# Patient Record
Sex: Male | Born: 1995 | Race: Black or African American | Hispanic: No | Marital: Single | Smoking: Never smoker
Health system: Southern US, Community
[De-identification: ages and names within clinical notes are randomized; demographics above are authoritative.]

## PROBLEM LIST (undated history)

## (undated) HISTORY — PX: TONSILLECTOMY: SUR1361

---
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
## 2017-06-26 ENCOUNTER — Encounter (INDEPENDENT_AMBULATORY_CARE_PROVIDER_SITE_OTHER): Payer: Self-pay

## 2017-06-26 ENCOUNTER — Encounter: Payer: Self-pay | Admitting: Family Medicine

## 2017-06-26 ENCOUNTER — Ambulatory Visit (INDEPENDENT_AMBULATORY_CARE_PROVIDER_SITE_OTHER): Payer: 59 | Admitting: Family Medicine

## 2017-06-26 DIAGNOSIS — R1030 Lower abdominal pain, unspecified: Secondary | ICD-10-CM

## 2017-06-26 DIAGNOSIS — R1032 Left lower quadrant pain: Secondary | ICD-10-CM | POA: Diagnosis not present

## 2017-06-26 HISTORY — DX: Lower abdominal pain, unspecified: R10.30

## 2017-06-26 NOTE — Assessment & Plan Note (Signed)
New problem. No evidence of hernia on exam. Suspect strain.  Advised PRN Ibuprofen if recurs. Call with concerns.

## 2017-06-26 NOTE — Patient Instructions (Signed)
Call me if it recurs.  Follow up annually.  Take care  Dr. Adriana Simasook

## 2017-06-26 NOTE — Progress Notes (Signed)
   Subjective:  Patient ID: Jim Richard, male    DOB: 07/21/1996  Age: 21 y.o. MRN: [id number]  CC: Establish care - Left inguinal pain  HPI Jim Richard is a 21 y.o. male presents to the clinic today to establish care. He has a complaint of left inguinal pain.  Inguinal pain  Left inguinal pain.  Started last month and it lasted for 2 weeks and subsequently resolved. Pain was moderate in severity.  No reports of bulging or mass.  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  No known exacerbating or relieving factors.  No reports of back pain. No testicular pain.  No other associated symptoms.  No other complaints or concerns at this time.  PMH, Surgical Hx, Family Hx, Social History reviewed and updated as below.  History reviewed. No pertinent past medical history.   Past Surgical History:  Procedure Laterality Date  . TONSILLECTOMY     Family History  Problem Relation Age of Onset  . Breast cancer Paternal Grandmother   . Hypertension Maternal Grandmother   . Diabetes Maternal Grandmother   . Stroke Maternal Grandfather   . Hypertension Maternal Grandfather    Social History  Substance Use Topics  . Smoking status: Current Every Day Smoker  . Smokeless tobacco: Never Used  . Alcohol use No   Review of Systems  Genitourinary:       Left inguinal pain.  All other systems reviewed and are negative.  Objective:   Today's Vitals: BP 90/62   Pulse 76   Temp 98.5 F (36.9 C) (Oral)   Ht 5\' 6"  (1.676 m)   Wt 131 lb (59.4 kg)   SpO2 98%   BMI 21.14 kg/m   Physical Exam  Constitutional: He is oriented to person, place, and time. He appears well-developed. No distress.  HENT:  Head: Normocephalic and atraumatic.  Mouth/Throat: Oropharynx is clear and moist.  Eyes: Conjunctivae are normal. No scleral icterus.  Neck: Neck supple.  Cardiovascular: Normal rate and regular rhythm.   No murmur  heard. Pulmonary/Chest: Effort normal and breath sounds normal. He has no wheezes. He has no rales.  Abdominal: Soft. He exhibits no distension. There is no tenderness. There is no rebound and no guarding. Hernia confirmed negative in the left inguinal area.  Genitourinary: Testes normal. Right testis shows no mass. Left testis shows no mass.  Lymphadenopathy:    He has no cervical adenopathy.  Neurological: He is alert and oriented to person, place, and time.  No focal deficits.  Skin: Skin is warm. No rash noted.  Psychiatric: He has a normal mood and affect. His behavior is normal. Thought content normal.  Vitals reviewed.  Assessment & Plan:   Problem List Items Addressed This Visit      Other   Inguinal pain    New problem. No evidence of hernia on exam. Suspect strain.  Advised PRN Ibuprofen if recurs. Call with concerns.        Follow-up: Annually/PRN  Everlene OtherJayce Mikiah Durall DO [facility] [place]

## 2017-10-12 ENCOUNTER — Ambulatory Visit (INDEPENDENT_AMBULATORY_CARE_PROVIDER_SITE_OTHER): Payer: 59

## 2017-10-12 DIAGNOSIS — Z23 Encounter for immunization: Secondary | ICD-10-CM | POA: Diagnosis not present

## 2019-07-01 ENCOUNTER — Ambulatory Visit: Payer: 59 | Admitting: Family Medicine

## 2020-02-09 ENCOUNTER — Ambulatory Visit
Admission: EM | Admit: 2020-02-09 | Discharge: 2020-02-09 | Disposition: A | Discharge status: Home / Self Care | Payer: Managed Care, Other (non HMO) | Attending: Emergency Medicine | Admitting: Emergency Medicine

## 2020-02-09 ENCOUNTER — Other Ambulatory Visit: Payer: Self-pay

## 2020-02-09 DIAGNOSIS — R509 Fever, unspecified: Secondary | ICD-10-CM | POA: Diagnosis not present

## 2020-02-09 DIAGNOSIS — J029 Acute pharyngitis, unspecified: Secondary | ICD-10-CM | POA: Diagnosis present

## 2020-02-09 LAB — POCT RAPID STREP A (OFFICE): Rapid Strep A Screen: NEGATIVE

## 2020-02-09 MED ORDER — AMOXICILLIN 875 MG PO TABS
875.0000 mg | ORAL_TABLET | Freq: Two times a day (BID) | ORAL | 0 refills | Status: AC
Start: 2020-02-09 — End: 2020-02-16

## 2020-02-09 NOTE — ED Provider Notes (Signed)
Jim Richard    CSN: [id number] Arrival date & time: 02/09/20  1717      History   Chief Complaint Chief Complaint  Patient presents with  . Sore Throat    HPI Jim Richard is a 24 y.o. male.   Patient presents with sore throat, fever, body aches x5 days.  He reports a negative COVID test on 02/06/2020.  T-max 101 2 days ago; fever today.  He denies difficulty swallowing, cough, shortness of breath, vomiting, diarrhea, rash, or other symptoms.  Treatment attempted at home with ibuprofen with moderate relief.  The history is provided by the patient.    History reviewed. No pertinent past medical history.  There are no problems to display for this patient.   Past Surgical History:  Procedure Laterality Date  . NO PAST SURGERIES         Home Medications    Prior to Admission medications   Medication Sig Start Date End Date Taking? Authorizing Provider  amoxicillin (AMOXIL) 875 MG tablet Take 1 tablet (875 mg total) by mouth 2 (two) times daily for 7 days. 02/09/20 02/16/20  Mickie Bail, NP    Family History Family History  Problem Relation Age of Onset  . Healthy Mother   . Healthy Father     Social History Social History   Tobacco Use  . Smoking status: Never Smoker  . Smokeless tobacco: Never Used  Substance Use Topics  . Alcohol use: Yes    Comment: occasionally  . Drug use: Never     Allergies   Patient has no known allergies.   Review of Systems Review of Systems  Constitutional: Positive for fever. Negative for chills.  HENT: Positive for sore throat. Negative for congestion, ear pain and rhinorrhea.   Eyes: Negative for pain and visual disturbance.  Respiratory: Negative for cough and shortness of breath.   Cardiovascular: Negative for chest pain and palpitations.  Gastrointestinal: Negative for abdominal pain, diarrhea, nausea and vomiting.  Genitourinary: Negative for dysuria and hematuria.  Musculoskeletal: Negative for  arthralgias and back pain.  Skin: Negative for color change and rash.  Neurological: Negative for seizures and syncope.  All other systems reviewed and are negative.    Physical Exam Triage Vital Signs ED Triage Vitals  Enc Vitals Group     BP 02/09/20 1721 118/81     Pulse Rate 02/09/20 1721 87     Resp 02/09/20 1721 18     Temp 02/09/20 1721 99.4 F (37.4 C)     Temp Source 02/09/20 1721 Oral     SpO2 02/09/20 1721 97 %     Weight 02/09/20 1720 135 lb (61.2 kg)     Height 02/09/20 1720 5\' 7"  (1.702 m)     Head Circumference --      Peak Flow --      Pain Score 02/09/20 1719 7     Pain Loc --      Pain Edu? --      Excl. in GC? --    No data found.  Updated Vital Signs BP 118/81 (BP Location: Left Arm)   Pulse 87   Temp 99.4 F (37.4 C) (Oral)   Resp 18   Ht 5\' 7"  (1.702 m)   Wt 135 lb (61.2 kg)   SpO2 97%   BMI 21.14 kg/m   Visual Acuity Right Eye Distance:   Left Eye Distance:   Bilateral Distance:    Right Eye Near:  Left Eye Near:    Bilateral Near:     Physical Exam Vitals and nursing note reviewed.  Constitutional:      Appearance: He is well-developed.  HENT:     Head: Normocephalic and atraumatic.     Right Ear: Tympanic membrane normal.     Left Ear: Tympanic membrane normal.     Nose: Nose normal.     Mouth/Throat:     Mouth: Mucous membranes are moist.     Pharynx: Posterior oropharyngeal erythema present.     Comments: Few small pustules noted on right side of pharynx.  No difficulty swallowing.   Eyes:     Conjunctiva/sclera: Conjunctivae normal.  Cardiovascular:     Rate and Rhythm: Normal rate and regular rhythm.     Heart sounds: No murmur.  Pulmonary:     Effort: Pulmonary effort is normal. No respiratory distress.     Breath sounds: Normal breath sounds.  Abdominal:     General: Bowel sounds are normal.     Palpations: Abdomen is soft.     Tenderness: There is no abdominal tenderness. There is no guarding or rebound.   Musculoskeletal:     Cervical back: Neck supple.  Skin:    General: Skin is warm and dry.     Findings: No rash.  Neurological:     General: No focal deficit present.     Mental Status: He is alert and oriented to person, place, and time.  Psychiatric:        Mood and Affect: Mood normal.        Behavior: Behavior normal.      UC Treatments / Results  Labs (all labs ordered are listed, but only abnormal results are displayed) Labs Reviewed  CULTURE, GROUP A STREP [facility])  POCT RAPID STREP A (OFFICE)  CYTOLOGY, (ORAL, ANAL, URETHRAL) ANCILLARY ONLY    EKG   Radiology No results found.  Procedures Procedures (including critical care time)  Medications Ordered in UC Medications - No data to display  Initial Impression / Assessment and Plan / UC Course  I have reviewed the triage vital signs and the nursing notes.  Pertinent labs & imaging results that were available during my care of the patient were reviewed by me and considered in my medical decision making (see chart for details).   Acute pharyngitis.  Rapid strep negative; throat culture pending.  Additionally, swab obtained for GC/chlamydia testing; patient is sexually active, including oral sex.  Discussed with patient that we will call him if his test results are positive requiring additional treatment.  Discussed with him that he should follow-up with his PCP if his symptoms or not improving.  Patient agrees to plan of care.      Final Clinical Impressions(s) / UC Diagnoses   Final diagnoses:  Sore throat     Discharge Instructions     Take the amoxicillin as directed.    Your rapid strep test is negative.  A throat culture is pending.  A throat swab for gonorrhea and chlamydia was also sent today.  We will call you if any of your test results are positive.  You may require additional treatment at that time.    Follow-up with your primary care provider if your symptoms are not improving.            ED Prescriptions    Medication Sig Dispense Auth. Provider   amoxicillin (AMOXIL) 875 MG tablet Take 1 tablet (875 mg total) by mouth 2 (  two) times daily for 7 days. 14 tablet Mickie Bail, NP     PDMP not reviewed this encounter.   Mickie Bail, NP 02/09/20 1757

## 2020-02-09 NOTE — Discharge Instructions (Addendum)
Take the amoxicillin as directed.    Your rapid strep test is negative.  A throat culture is pending.  A throat swab for gonorrhea and chlamydia was also sent today.  We will call you if any of your test results are positive.  You may require additional treatment at that time.    Follow-up with your primary care provider if your symptoms are not improving.

## 2020-02-09 NOTE — ED Triage Notes (Signed)
Patient states that on Saturday morning he started having a sore throat, fevers, body aches. Patient was tested for Covid on Monday and test was negative.

## 2020-02-11 LAB — CYTOLOGY, (ORAL, ANAL, URETHRAL) ANCILLARY ONLY
Chlamydia: NEGATIVE
Neisseria Gonorrhea: NEGATIVE

## 2020-02-12 LAB — CULTURE, GROUP A STREP (THRC)

## 2020-03-17 ENCOUNTER — Ambulatory Visit: Payer: Managed Care, Other (non HMO) | Attending: Internal Medicine

## 2020-03-17 DIAGNOSIS — Z23 Encounter for immunization: Secondary | ICD-10-CM

## 2020-03-17 NOTE — Progress Notes (Signed)
   Covid-19 Vaccination Clinic  Name:  Jim Richard    MRN: [id number] DOB: 1996-06-05  03/17/2020  [name] was observed post Covid-19 immunization for 15 minutes without incident. He was provided with Vaccine Information Sheet and instruction to access the V-Safe system.   [name] was instructed to call 911 with any severe reactions post vaccine: Marland Kitchen Difficulty breathing  . Swelling of face and throat  . A fast heartbeat  . A bad rash all over body  . Dizziness and weakness   Immunizations Administered    Name Date Dose VIS Date Route   Pfizer COVID-19 Vaccine 03/17/2020  9:58 AM 0.3 mL 11/18/2019 Intramuscular   Manufacturer: [employer], [employer]   Lot: [id number]   NDC: [address]-4

## 2020-04-10 ENCOUNTER — Ambulatory Visit: Payer: Managed Care, Other (non HMO) | Attending: Internal Medicine

## 2020-04-10 DIAGNOSIS — Z23 Encounter for immunization: Secondary | ICD-10-CM

## 2020-04-10 NOTE — Progress Notes (Signed)
   Covid-19 Vaccination Clinic  Name:  Jim Richard    MRN: [id number] DOB: [birth year]  04/10/2020  [name] was observed post Covid-19 immunization for 15 minutes without incident. He was provided with Vaccine Information Sheet and instruction to access the V-Safe system.   [name] was instructed to call 911 with any severe reactions post vaccine: Marland Kitchen Difficulty breathing  . Swelling of face and throat  . A fast heartbeat  . A bad rash all over body  . Dizziness and weakness   Immunizations Administered    Name Date Dose VIS Date Route   Pfizer COVID-19 Vaccine 04/10/2020  1:29 PM 0.3 mL 02/01/2019 Intramuscular   Manufacturer: [employer], [employer]   Lot: [id number]   NDC: [address]-9

## 2020-04-30 ENCOUNTER — Ambulatory Visit (INDEPENDENT_AMBULATORY_CARE_PROVIDER_SITE_OTHER): Payer: Managed Care, Other (non HMO) | Admitting: Nurse Practitioner

## 2020-04-30 ENCOUNTER — Encounter: Payer: Self-pay | Admitting: Nurse Practitioner

## 2020-04-30 ENCOUNTER — Other Ambulatory Visit: Payer: Self-pay

## 2020-04-30 VITALS — BP 110/78 | HR 86 | Temp 97.4°F | Ht 66.0 in | Wt 133.0 lb

## 2020-04-30 DIAGNOSIS — F418 Other specified anxiety disorders: Secondary | ICD-10-CM

## 2020-04-30 DIAGNOSIS — Z720 Tobacco use: Secondary | ICD-10-CM | POA: Diagnosis not present

## 2020-04-30 DIAGNOSIS — Z1159 Encounter for screening for other viral diseases: Secondary | ICD-10-CM | POA: Insufficient documentation

## 2020-04-30 DIAGNOSIS — F419 Anxiety disorder, unspecified: Secondary | ICD-10-CM

## 2020-04-30 DIAGNOSIS — Z13220 Encounter for screening for lipoid disorders: Secondary | ICD-10-CM | POA: Diagnosis not present

## 2020-04-30 DIAGNOSIS — Z0000 Encounter for general adult medical examination without abnormal findings: Secondary | ICD-10-CM

## 2020-04-30 DIAGNOSIS — Z131 Encounter for screening for diabetes mellitus: Secondary | ICD-10-CM | POA: Diagnosis not present

## 2020-04-30 DIAGNOSIS — Z114 Encounter for screening for human immunodeficiency virus [HIV]: Secondary | ICD-10-CM | POA: Insufficient documentation

## 2020-04-30 LAB — COMPREHENSIVE METABOLIC PANEL
ALT: 17 U/L (ref 0–53)
AST: 22 U/L (ref 0–37)
Albumin: 4.8 g/dL (ref 3.5–5.2)
Alkaline Phosphatase: 58 U/L (ref 39–117)
BUN: 19 mg/dL (ref 6–23)
CO2: 29 mEq/L (ref 19–32)
Calcium: 9.9 mg/dL (ref 8.4–10.5)
Chloride: 103 mEq/L (ref 96–112)
Creatinine, Ser: 1.03 mg/dL (ref 0.40–1.50)
GFR: 109.74 mL/min (ref >60.00)
Glucose, Bld: 88 mg/dL (ref 70–99)
Potassium: 4.7 mEq/L (ref 3.5–5.1)
Sodium: 139 mEq/L (ref 135–145)
Total Bilirubin: 0.3 mg/dL (ref 0.2–1.2)
Total Protein: 6.8 g/dL (ref 6.0–8.3)

## 2020-04-30 LAB — CBC WITH DIFFERENTIAL/PLATELET
Basophils Absolute: 0 10*3/uL (ref 0.0–0.1)
Basophils Relative: 1.2 % (ref 0.0–3.0)
Eosinophils Absolute: 0.1 10*3/uL (ref 0.0–0.7)
Eosinophils Relative: 3.3 % (ref 0.0–5.0)
HCT: 42.7 % (ref 39.0–52.0)
Hemoglobin: 14.3 g/dL (ref 13.0–17.0)
Lymphocytes Relative: 54 % — ABNORMAL HIGH (ref 12.0–46.0)
Lymphs Abs: 2.1 10*3/uL (ref 0.7–4.0)
MCHC: 33.5 g/dL (ref 30.0–36.0)
MCV: 87 fl (ref 78.0–100.0)
Monocytes Absolute: 0.2 10*3/uL (ref 0.1–1.0)
Monocytes Relative: 5.6 % (ref 3.0–12.0)
Neutro Abs: 1.4 10*3/uL (ref 1.4–7.7)
Neutrophils Relative %: 35.9 % — ABNORMAL LOW (ref 43.0–77.0)
Platelets: 279 10*3/uL (ref 150.0–400.0)
RBC: 4.91 Mil/uL (ref 4.22–5.81)
RDW: 13.3 % (ref 11.5–15.5)
WBC: 3.9 10*3/uL — ABNORMAL LOW (ref 4.0–10.5)

## 2020-04-30 LAB — VITAMIN D 25 HYDROXY (VIT D DEFICIENCY, FRACTURES): VITD: 18.82 ng/mL — ABNORMAL LOW (ref 30.00–100.00)

## 2020-04-30 LAB — LIPID PANEL
Cholesterol: 137 mg/dL (ref 0–200)
HDL: 51.9 mg/dL (ref >39.00)
LDL Cholesterol: 69 mg/dL (ref 0–99)
NonHDL: 85.06
Total CHOL/HDL Ratio: 3
Triglycerides: 81 mg/dL (ref 0.0–149.0)
VLDL: 16.2 mg/dL (ref 0.0–40.0)

## 2020-04-30 LAB — TSH: TSH: 2.4 u[IU]/mL (ref 0.35–4.50)

## 2020-04-30 LAB — B12 AND FOLATE PANEL
Folate: 22.7 ng/mL (ref >5.9)
Vitamin B-12: 1136 pg/mL — ABNORMAL HIGH (ref 211–911)

## 2020-04-30 NOTE — Patient Instructions (Addendum)
It was very nice to meet you today.   Please go to the lab for routine labs. We will call you with results or message via MyChart if you use that.   I have placed a referral to Psychiatry for anxiety/depression  and your concerns about  ADHD. I asked that this be done ASAP. If you feel like you need to talk to someone more urgently- call [phone].   Please explore ways to relax and see the recommendations below.  Please return in 1 mos for follow up visit.    Managing Anxiety, Adult After being diagnosed with an anxiety disorder, you may be relieved to know why you have felt or behaved a certain way. You may also feel overwhelmed about the treatment ahead and what it will mean for your life. With care and support, you can manage this condition and recover from it. How to manage lifestyle changes Managing stress and anxiety  Stress is your body's reaction to life changes and events, both good and bad. Most stress will last just a few hours, but stress can be ongoing and can lead to more than just stress. Although stress can play a major role in anxiety, it is not the same as anxiety. Stress is usually caused by something external, such as a deadline, test, or competition. Stress normally passes after the triggering event has ended.  Anxiety is caused by something internal, such as imagining a terrible outcome or worrying that something will go wrong that will devastate you. Anxiety often does not go away even after the triggering event is over, and it can become long-term (chronic) worry. It is important to understand the differences between stress and anxiety and to manage your stress effectively so that it does not lead to an anxious response. Talk with your health care provider or a counselor to learn more about reducing anxiety and stress. He or she may suggest tension reduction techniques, such as:  Music therapy. This can include creating or listening to music that you enjoy and that  inspires you.  Mindfulness-based meditation. This involves being aware of your normal breaths while not trying to control your breathing. It can be done while sitting or walking.  Centering prayer. This involves focusing on a word, phrase, or sacred image that means something to you and brings you peace.  Deep breathing. To do this, expand your stomach and inhale slowly through your nose. Hold your breath for 3-5 seconds. Then exhale slowly, letting your stomach muscles relax.  Self-talk. This involves identifying thought patterns that lead to anxiety reactions and changing those patterns.  Muscle relaxation. This involves tensing muscles and then relaxing them. Choose a tension reduction technique that suits your lifestyle and personality. These techniques take time and practice. Set aside 5-15 minutes a day to do them. Therapists can offer counseling and training in these techniques. The training to help with anxiety may be covered by some insurance plans. Other things you can do to manage stress and anxiety include:  Keeping a stress/anxiety diary. This can help you learn what triggers your reaction and then learn ways to manage your response.  Thinking about how you react to certain situations. You may not be able to control everything, but you can control your response.  Making time for activities that help you relax and not feeling guilty about spending your time in this way.  Visual imagery and yoga can help you stay calm and relax.  Medicines Medicines can help ease symptoms. Medicines  for anxiety include:  Anti-anxiety drugs.  Antidepressants. Medicines are often used as a primary treatment for anxiety disorder. Medicines will be prescribed by a health care provider. When used together, medicines, psychotherapy, and tension reduction techniques may be the most effective treatment. Relationships Relationships can play a big part in helping you recover. Try to spend more time  connecting with trusted friends and family members. Consider going to couples counseling, taking family education classes, or going to family therapy. Therapy can help you and others better understand your condition. How to recognize changes in your anxiety Everyone responds differently to treatment for anxiety. Recovery from anxiety happens when symptoms decrease and stop interfering with your daily activities at home or work. This may mean that you will start to:  Have better concentration and focus. Worry will interfere less in your daily thinking.  Sleep better.  Be less irritable.  Have more energy.  Have improved memory. It is important to recognize when your condition is getting worse. Contact your health care provider if your symptoms interfere with home or work and you feel like your condition is not improving. Follow these instructions at home: Activity  Exercise. Most adults should do the following: ? Exercise for at least 150 minutes each week. The exercise should increase your heart rate and make you sweat (moderate-intensity exercise). ? Strengthening exercises at least twice a week.  Get the right amount and quality of sleep. Most adults need 7-9 hours of sleep each night. Lifestyle   Eat a healthy diet that includes plenty of vegetables, fruits, whole grains, low-fat dairy products, and lean protein. Do not eat a lot of foods that are high in solid fats, added sugars, or salt.  Make choices that simplify your life.  Do not use any products that contain nicotine or tobacco, such as cigarettes, e-cigarettes, and chewing tobacco. If you need help quitting, ask your health care provider.  Avoid caffeine, alcohol, and certain over-the-counter cold medicines. These may make you feel worse. Ask your pharmacist which medicines to avoid. General instructions  Take over-the-counter and prescription medicines only as told by your health care provider.  Keep all follow-up  visits as told by your health care provider. This is important. Where to find support You can get help and support from these sources:  Self-help groups.  Online and [employer].  A trusted spiritual leader.  Couples counseling.  Family education classes.  Family therapy. Where to find more information You may find that joining a support group helps you deal with your anxiety. The following sources can help you locate counselors or support groups near you:  [place]: www.mentalhealth[place].net  Anxiety and Depression Association of [place] (ADAA): https://www.clark.net/  National Alliance on Mental Illness (NAMI): www.nami.org Contact a health care provider if you:  Have a hard time staying focused or finishing daily tasks.  Spend many hours a day feeling worried about everyday life.  Become exhausted by worry.  Start to have headaches, feel tense, or have nausea.  Urinate more than normal.  Have diarrhea. Get help right away if you have:  A racing heart and shortness of breath.  Thoughts of hurting yourself or others. If you ever feel like you may hurt yourself or others, or have thoughts about taking your own life, get help right away. You can go to your nearest emergency department or call:  Your local emergency services (911 in the [place]  A suicide crisis helpline, such as the [place]  at 3162560979. This is open 24 hours a day. Summary  Taking steps to learn and use tension reduction techniques can help calm you and help prevent triggering an anxiety reaction.  When used together, medicines, psychotherapy, and tension reduction techniques may be the most effective treatment.  Family, friends, and partners can play a big part in helping you recover from an anxiety disorder. This information is not intended to replace advice given to you by your health care provider. Make sure you discuss any questions you have  with your health care provider. Document Revised: 04/26/2019 Document Reviewed: 04/26/2019 Elsevier Patient Education  2020 [employer].  Depression Screening Depression screening is a tool that your health care provider can use to learn if you have symptoms of depression. Depression is a common condition with many symptoms that are also often found in other conditions. Depression is treatable, but it must first be diagnosed. You may not know that certain feelings, thoughts, and behaviors that you are having can be symptoms of depression. Taking a depression screening test can help you and your health care provider decide if you need more assessment, or if you should be referred to a mental health care provider. What are the screening tests?  You may have a physical exam to see if another condition is affecting your mental health. You may have a blood or urine sample taken during the physical exam.  You may be interviewed using a screening tool that was developed from research, such as one of these: ? Patient Health Questionnaire (PHQ). This is a set of either 2 or 9 questions. A health care provider who has been trained to score this screening test uses a guide to assess if your symptoms suggest that you may have depression. ? Hamilton Depression Rating Scale (HAM-D). This is a set of either 17 or 24 questions. You may be asked to take it again during or after your treatment, to see if your depression has gotten better. ? Beck Depression Inventory (BDI). This is a set of 21 multiple choice questions. Your health care provider scores your answers to assess:  Your level of depression, ranging from mild to severe.  Your response to treatment.  Your health care provider may talk with you about your daily activities, such as eating, sleeping, work, and recreation, and ask if you have had any changes in activity.  Your health care provider may ask you to see a mental health specialist, such as a  psychiatrist or psychologist, for more evaluation. Who should be screened for depression?   All adults, including adults with a family history of a mental health disorder.  Adolescents who are 69-94 years old.  People who are recovering from a myocardial infarction (MI).  Pregnant women, or women who have given birth.  People who have a long-term (chronic) illness.  Anyone who has been diagnosed with another type of a mental health disorder.  Anyone who has symptoms that could show depression. What do my results mean? Your health care provider will review the results of your depression screening, physical exam, and lab tests. Positive screens suggest that you may have depression. Screening is the first step in getting the care that you may need. It is up to you to get your screening results. Ask your health care provider, or the department that is doing your screening tests, when your results will be ready. Talk with your health care provider about your results and diagnosis. A diagnosis of depression is made using  the Diagnostic and Statistical Manual of Mental Disorders (DSM-V). This is a book that lists the number and type of symptoms that must be present for a health care provider to give a specific diagnosis.  Your health care provider may work with you to treat your symptoms of depression, or your health care provider may help you find a mental health provider who can assess, diagnose, and treat your depression. Get help right away if:  You have thoughts about hurting yourself or others. If you ever feel like you may hurt yourself or others, or have thoughts about taking your own life, get help right away. You can go to your nearest emergency department or call:  Your local emergency services (911 in the [place]  A suicide crisis helpline, such as the National Suicide Prevention Lifeline at 367-866-8208. This is open 24 hours a day. Summary  Depression screening is the first  step in getting the help that you may need.  If your screening test shows symptoms of depression (is positive), your health care provider may ask you to see a mental health provider.  Anyone who is age 19 or older should be screened for depression. This information is not intended to replace advice given to you by your health care provider. Make sure you discuss any questions you have with your health care provider. Document Revised: 11/06/2017 Document Reviewed: 04/10/2017 Elsevier Patient Education  2020 [employer].

## 2020-04-30 NOTE — Progress Notes (Signed)
New Patient Office Visit  Subjective:  Patient ID: Jim Richard, male    DOB: Sep 20, 1996  Age: 24 y.o. MRN: [id number]  CC:  Chief Complaint  Patient presents with  . New Patient (Initial Visit)    establish care    HPI Jim Richard is a 24 yo old with no significant PMH presents to establish care with a primary care provider.  He wishes to discuss ADHD.  People he works with are asking him if he has it.  His little brother has ADHD.  The patient skips from topic to topic during conversation.  He does not like to be around people in large groups.  He has social anxiety.  This started in high school. People stare at him. He worries and feels anxious a lot. He strays away from movies and feels like he does not pay attention. He had trouble studying in school. He is working at [employer] now. He is single lives at home with his parents and siblings.   Preventative Healthcare:   Immunizations  Covid: yes  Hepatitis C screening - no  Labs: no  Exercise: active at work and plays basketball  Alcohol use: yes- 8 shots of liquor in one day on weekends- counseled  Smoking/tobacco use: vaping daily nicotine-counseled  STD/HIV testing: no  Regular dental exams: yes  Vision: yes  Wears seat belt: always  Text driving: sometimes counseled    History reviewed. No pertinent past medical history.  Past Surgical History:  Procedure Laterality Date  . TONSILLECTOMY      Family History  Problem Relation Age of Onset  . Healthy Mother   . Healthy Father   . Breast cancer Paternal Grandmother   . Hypertension Maternal Grandmother   . Diabetes Maternal Grandmother   . Stroke Maternal Grandfather   . Hypertension Maternal Grandfather   . ADD / ADHD Brother     Social History   Socioeconomic History  . Marital status: Single    Spouse name: Not on file  . Number of children: Not on file  . Years of education: Not on file  . Highest education level: GED or  equivalent  Occupational History  . Not on file  Tobacco Use  . Smoking status: Current Every Day Smoker    Packs/day: 0.50    Types: Cigarettes  . Smokeless tobacco: Never Used  Substance and Sexual Activity  . Alcohol use: Yes    Comment: occasionally-8 shots of liquor on weekend one day   . Drug use: Never  . Sexual activity: Yes    Birth control/protection: Condom  Other Topics Concern  . Not on file  Social History Narrative       Single lives with parents and 2 younger brothers   Social Determinants of Health   Financial Resource Strain:   . Difficulty of Paying Living Expenses:   Food Insecurity:   . Worried About Programme researcher, broadcasting/film/video in the Last Year:   . Barista in the Last Year:   Transportation Needs:   . Freight forwarder (Medical):   Marland Kitchen Lack of Transportation (Non-Medical):   Physical Activity:   . Days of Exercise per Week:   . Minutes of Exercise per Session:   Stress:   . Feeling of Stress :   Social Connections:   . Frequency of Communication with Friends and Family:   . Frequency of Social Gatherings with Friends and Family:   . Attends Religious Services:   .  Active Member of Clubs or Organizations:   . Attends Archivist Meetings:   Marland Kitchen Marital Status:   Intimate Partner Violence:   . Fear of Current or Ex-Partner:   . Emotionally Abused:   Marland Kitchen Physically Abused:   . Sexually Abused:     ROS Review of Systems  Constitutional: Negative for activity change, fatigue, fever and unexpected weight change.  HENT: Negative for congestion and sinus pain.   Eyes: Negative.   Respiratory: Negative for cough and shortness of breath.   Cardiovascular: Negative for chest pain and leg swelling.  Gastrointestinal: Negative for abdominal pain, blood in stool, constipation, diarrhea, nausea and vomiting.  Genitourinary: Negative for difficulty urinating.  Musculoskeletal: Negative for back pain and joint swelling.  Skin: Negative for rash.   Allergic/Immunologic: Positive for environmental allergies.       Nose runs at [place] where he works if he spends time in the [place]- esp near [place] straw. He does not think it is bad enough to take any meds for it.   Neurological: Negative for dizziness, tremors, seizures and headaches.  Hematological: Negative.  Does not bruise/bleed easily.  Psychiatric/Behavioral:       Positive anxiety and wants tested for ADHD.     Objective:   Today's Vitals: BP 110/78 (BP Location: Left Arm, Patient Position: Sitting, Cuff Size: Small)   Pulse 86   Temp (!) 97.4 F (36.3 C) (Skin)   Ht 5\' 6"  (1.676 m)   Wt 133 lb (60.3 kg)   SpO2 98%   BMI 21.47 kg/m   Physical Exam Vitals reviewed.  Constitutional:      Appearance: Normal appearance. He is normal weight.  HENT:     Head: Normocephalic.  Eyes:     Conjunctiva/sclera: Conjunctivae normal.     Pupils: Pupils are equal, round, and reactive to light.  Cardiovascular:     Rate and Rhythm: Normal rate and regular rhythm.     Pulses: Normal pulses.     Heart sounds: Normal heart sounds.  Pulmonary:     Effort: Pulmonary effort is normal.     Breath sounds: Normal breath sounds.  Abdominal:     General: Abdomen is flat.     Palpations: Abdomen is soft.     Tenderness: There is no abdominal tenderness.  Musculoskeletal:        General: Normal range of motion.  Skin:    General: Skin is warm and dry.  Neurological:     General: No focal deficit present.     Mental Status: He is alert and oriented to person, place, and time.  Psychiatric:        Behavior: Behavior normal.        Thought Content: Thought content normal.     Comments: Flat affect, PHQ-9: 15, GAD-7: 18. No SI- " I would never do that. " No HI.      Assessment & Plan:   Problem List Items Addressed This Visit    None    Visit Diagnoses    Encounter for medical examination to establish care    -  Primary   Anxiety       Relevant Orders   CBC with  Differential/Platelet (Completed)   TSH (Completed)   VITAMIN D 25 Hydroxy (Vit-D Deficiency, Fractures) (Completed)   Ambulatory referral to Psychiatry   B12 and Folate Panel (Completed)   Tobacco abuse       Screening cholesterol level  Relevant Orders   Lipid panel (Completed)   Screening for diabetes mellitus (DM)       Relevant Orders   Comprehensive metabolic panel (Completed)   Screening for HIV (human immunodeficiency virus)       Relevant Orders   HIV antibody (with reflex)   Encounter for HCV screening test for low risk patient       Relevant Orders   Hepatitis C Antibody      No outpatient encounter medications on file as of 04/30/2020.   No facility-administered encounter medications on file as of 04/30/2020.   Please go to the lab for routine labs. We will call you with results or message via MyChart if you use that.   I have placed a referral to Psychiatry for anxiety/depression  and your concerns about  ADHD. I asked that this be done ASAP. If you feel like you need to talk to someone more urgently- call [phone].   Please explore ways to relax and see the recommendations below.  Please return in 1 mos for follow up visit.  AVS: ANXIETY MANAGING and the meaning of his DEPRESSION SCREENING   A total of 45 minutes of face to face time was spent with patient more than half of which was spent in counseling and coordination of care    Follow-up: Return in about 1 month (around 05/31/2020).   Amedeo Kinsman, NP

## 2020-04-30 NOTE — Assessment & Plan Note (Signed)
High scores on screening tests . No SI/HI. Psych referral. He wants tested for ADHD.

## 2020-04-30 NOTE — Assessment & Plan Note (Signed)
Using nicotine to relax for untreated anxiety. Referral to psych for anxiety. We discussed why nicotine is bad for health.

## 2020-05-01 LAB — HEPATITIS C ANTIBODY
Hepatitis C Ab: NONREACTIVE
SIGNAL TO CUT-OFF: 0 (ref <1.00)

## 2020-05-01 LAB — HIV ANTIBODY (ROUTINE TESTING W REFLEX): HIV 1&2 Ab, 4th Generation: NONREACTIVE

## 2020-05-31 ENCOUNTER — Ambulatory Visit: Payer: Managed Care, Other (non HMO) | Admitting: Nurse Practitioner

## 2020-05-31 DIAGNOSIS — Z0289 Encounter for other administrative examinations: Secondary | ICD-10-CM

## 2023-04-23 ENCOUNTER — Encounter: Payer: Self-pay | Admitting: Nurse Practitioner

## 2023-04-23 ENCOUNTER — Ambulatory Visit: Payer: Managed Care, Other (non HMO) | Admitting: Nurse Practitioner

## 2023-04-23 ENCOUNTER — Other Ambulatory Visit: Payer: Self-pay | Admitting: Nurse Practitioner

## 2023-04-23 VITALS — BP 100/62 | HR 71 | Temp 98.8°F | Ht 66.0 in | Wt 133.6 lb

## 2023-04-23 DIAGNOSIS — Z1329 Encounter for screening for other suspected endocrine disorder: Secondary | ICD-10-CM | POA: Diagnosis not present

## 2023-04-23 DIAGNOSIS — Z23 Encounter for immunization: Secondary | ICD-10-CM

## 2023-04-23 DIAGNOSIS — Z13220 Encounter for screening for lipoid disorders: Secondary | ICD-10-CM

## 2023-04-23 DIAGNOSIS — E559 Vitamin D deficiency, unspecified: Secondary | ICD-10-CM

## 2023-04-23 DIAGNOSIS — D709 Neutropenia, unspecified: Secondary | ICD-10-CM | POA: Insufficient documentation

## 2023-04-23 DIAGNOSIS — F418 Other specified anxiety disorders: Secondary | ICD-10-CM | POA: Diagnosis not present

## 2023-04-23 DIAGNOSIS — Z0000 Encounter for general adult medical examination without abnormal findings: Secondary | ICD-10-CM

## 2023-04-23 DIAGNOSIS — Z131 Encounter for screening for diabetes mellitus: Secondary | ICD-10-CM | POA: Diagnosis not present

## 2023-04-23 LAB — COMPREHENSIVE METABOLIC PANEL
ALT: 10 U/L (ref 0–53)
AST: 12 U/L (ref 0–37)
Albumin: 4.8 g/dL (ref 3.5–5.2)
Alkaline Phosphatase: 53 U/L (ref 39–117)
BUN: 24 mg/dL — ABNORMAL HIGH (ref 6–23)
CO2: 28 mEq/L (ref 19–32)
Calcium: 9.7 mg/dL (ref 8.4–10.5)
Chloride: 102 mEq/L (ref 96–112)
Creatinine, Ser: 1.26 mg/dL (ref 0.40–1.50)
GFR: 79.82 mL/min (ref >60.00)
Glucose, Bld: 90 mg/dL (ref 70–99)
Potassium: 4.6 mEq/L (ref 3.5–5.1)
Sodium: 137 mEq/L (ref 135–145)
Total Bilirubin: 0.4 mg/dL (ref 0.2–1.2)
Total Protein: 7.1 g/dL (ref 6.0–8.3)

## 2023-04-23 LAB — CBC WITH DIFFERENTIAL/PLATELET
Basophils Absolute: 0 10*3/uL (ref 0.0–0.1)
Basophils Relative: 1.2 % (ref 0.0–3.0)
Eosinophils Absolute: 0 10*3/uL (ref 0.0–0.7)
Eosinophils Relative: 1.1 % (ref 0.0–5.0)
HCT: 43.4 % (ref 39.0–52.0)
Hemoglobin: 14.4 g/dL (ref 13.0–17.0)
Lymphocytes Relative: 53.6 % — ABNORMAL HIGH (ref 12.0–46.0)
Lymphs Abs: 1.8 10*3/uL (ref 0.7–4.0)
MCHC: 33.1 g/dL (ref 30.0–36.0)
MCV: 88.3 fl (ref 78.0–100.0)
Monocytes Absolute: 0.3 10*3/uL (ref 0.1–1.0)
Monocytes Relative: 8.4 % (ref 3.0–12.0)
Neutro Abs: 1.2 10*3/uL — ABNORMAL LOW (ref 1.4–7.7)
Neutrophils Relative %: 35.7 % — ABNORMAL LOW (ref 43.0–77.0)
Platelets: 290 10*3/uL (ref 150.0–400.0)
RBC: 4.92 Mil/uL (ref 4.22–5.81)
RDW: 13.2 % (ref 11.5–15.5)
WBC: 3.3 10*3/uL — ABNORMAL LOW (ref 4.0–10.5)

## 2023-04-23 LAB — LIPID PANEL
Cholesterol: 179 mg/dL (ref 0–200)
HDL: 63 mg/dL (ref >39.00)
LDL Cholesterol: 96 mg/dL (ref 0–99)
NonHDL: 115.76
Total CHOL/HDL Ratio: 3
Triglycerides: 97 mg/dL (ref 0.0–149.0)
VLDL: 19.4 mg/dL (ref 0.0–40.0)

## 2023-04-23 LAB — HEMOGLOBIN A1C: Hgb A1c MFr Bld: 5.6 % (ref 4.6–6.5)

## 2023-04-23 LAB — VITAMIN D 25 HYDROXY (VIT D DEFICIENCY, FRACTURES): VITD: 8.43 ng/mL — ABNORMAL LOW (ref 30.00–100.00)

## 2023-04-23 LAB — TSH: TSH: 2.07 u[IU]/mL (ref 0.35–5.50)

## 2023-04-23 MED ORDER — VITAMIN D (ERGOCALCIFEROL) 1.25 MG (50000 UNIT) PO CAPS
50000.0000 [IU] | ORAL_CAPSULE | ORAL | 1 refills | Status: AC
Start: 2023-04-23 — End: ?

## 2023-04-23 NOTE — Assessment & Plan Note (Signed)
Chronic. Stable at this time. He feels that his symptoms are well managed and does not need medication or to see a counselor/therapist. PHQ- 8 and GAD- 6. Denies SI/HI. Reports increased anxiety only when having to drive on the interstate/highway. Encouraged to contact if worsening or changing symptoms. Will continue to monitor.

## 2023-04-23 NOTE — Assessment & Plan Note (Signed)
Physical exam complete. Lab work as outlined. Will contact patient with results. Flu vaccine not due. Tetanus vaccine- due, given today in office. Declined additional COVID vaccines. HIV/Hep C screenings negative. Counseled on vaping and nicotine use. Recommended follow ups with Dentist and Ophthalmology for annual exams. Encouraged to work on [employer], decreasing sodium intake, and continue exercising. Return to care in one year, sooner PRN.

## 2023-04-23 NOTE — Progress Notes (Signed)
Bethanie Dicker, NP-C Phone: [phone]  Jim Richard is a 27 y.o. male who presents today for transfer of care and annual exam. He has no complaints or new concerns today. He is not on any medications.   Diet: Poor- Ramen noodles, does not each much vegetables, likes fruit, eats out frequently Exercise: Push ups and sit ups, does a lot of lifting at work Family history-  Prostate cancer: No  Colon cancer: No Sexually active: Yes Vaccines-   Flu: Not due  Tetanus: 07/06/2009- Due!  COVID19: x 2 HIV screening: Negative Hep C Screening: Negative Tobacco use: Yes, vaping Alcohol use: Yes, twice a week Illicit Drug use: No Dentist: Yes Ophthalmology: Yes   Social History   Tobacco Use  Smoking Status Former   Packs/day: .5   Types: Cigarettes  Smokeless Tobacco Never    No current outpatient medications on file prior to visit.   No current facility-administered medications on file prior to visit.    ROS see history of present illness  Objective  Physical Exam Vitals:   04/23/23 1251  BP: 100/62  Pulse: 71  Temp: 98.8 F (37.1 C)  SpO2: 98%    BP Readings from Last 3 Encounters:  04/23/23 100/62  04/30/20 110/78  02/09/20 118/81   Wt Readings from Last 3 Encounters:  04/23/23 133 lb 9.6 oz (60.6 kg)  04/30/20 133 lb (60.3 kg)  02/09/20 135 lb (61.2 kg)    Physical Exam Constitutional:      General: He is not in acute distress.    Appearance: Normal appearance.  HENT:     Head: Normocephalic.     Right Ear: Tympanic membrane normal.     Left Ear: Tympanic membrane normal.     Nose: Nose normal.     Mouth/Throat:     Mouth: Mucous membranes are moist.     Pharynx: Oropharynx is clear.  Eyes:     Conjunctiva/sclera: Conjunctivae normal.     Pupils: Pupils are equal, round, and reactive to light.  Neck:     Thyroid: No thyromegaly.  Cardiovascular:     Rate and Rhythm: Normal rate and regular rhythm.     Heart sounds: Normal heart sounds.   Pulmonary:     Effort: Pulmonary effort is normal.     Breath sounds: Normal breath sounds.  Abdominal:     General: Abdomen is flat. Bowel sounds are normal.     Palpations: Abdomen is soft. There is no mass.     Tenderness: There is no abdominal tenderness.  Musculoskeletal:        General: Normal range of motion.  Lymphadenopathy:     Cervical: No cervical adenopathy.  Skin:    General: Skin is warm and dry.     Findings: No rash.  Neurological:     General: No focal deficit present.     Mental Status: He is alert.  Psychiatric:        Mood and Affect: Mood normal.        Behavior: Behavior normal.    Assessment/Plan: Please see individual problem list.  Preventative health care Assessment & Plan: Physical exam complete. Lab work as outlined. Will contact patient with results. Flu vaccine not due. Tetanus vaccine- due, given today in office. Declined additional COVID vaccines. HIV/Hep C screenings negative. Counseled on vaping and nicotine use. Recommended follow ups with Dentist and Ophthalmology for annual exams. Encouraged to work on [employer], decreasing sodium intake, and continue exercising. Return to care  in one year, sooner PRN.   Orders: -     CBC with Differential/Platelet -     Comprehensive metabolic panel  Depression with anxiety Assessment & Plan: Chronic. Stable at this time. He feels that his symptoms are well managed and does not need medication or to see a counselor/therapist. PHQ- 8 and GAD- 6. Denies SI/HI. Reports increased anxiety only when having to drive on the interstate/highway. Encouraged to contact if worsening or changing symptoms. Will continue to monitor.    Vitamin D deficiency Assessment & Plan: Noted on lab work in 2021. Not taking a supplement. Will check vitamin D level today.   Orders: -     VITAMIN D 25 Hydroxy (Vit-D Deficiency, Fractures)  Screening for diabetes mellitus (DM) Assessment & Plan: Family Hx of diabetes.  Patient requesting screening. Will check A1c today. Encouraged healthy diet and exercise to decrease risk of Type 2 Diabetes.   Orders: -     Hemoglobin A1c  Lipid screening -     Lipid panel  Thyroid disorder screen -     TSH  Need for Tdap vaccination -     Tdap vaccine greater than or equal to 7yo IM   Return in about 1 year (around 04/22/2024) for Annual Exam, sooner PRN.   Bethanie Dicker, NP-C [facility] - [employer]

## 2023-04-23 NOTE — Assessment & Plan Note (Signed)
Noted on lab work in 2021. Not taking a supplement. Will check vitamin D level today.

## 2023-04-23 NOTE — Assessment & Plan Note (Signed)
Family Hx of diabetes. Patient requesting screening. Will check A1c today. Encouraged healthy diet and exercise to decrease risk of Type 2 Diabetes.

## 2023-04-30 ENCOUNTER — Inpatient Hospital Stay: Payer: Managed Care, Other (non HMO)

## 2023-04-30 ENCOUNTER — Encounter: Payer: Self-pay | Admitting: Oncology

## 2023-04-30 ENCOUNTER — Inpatient Hospital Stay: Payer: Managed Care, Other (non HMO) | Attending: Oncology | Admitting: Oncology

## 2023-04-30 VITALS — BP 109/70 | HR 74 | Temp 96.3°F | Resp 18 | Wt 133.6 lb

## 2023-04-30 DIAGNOSIS — D708 Other neutropenia: Secondary | ICD-10-CM

## 2023-04-30 DIAGNOSIS — D709 Neutropenia, unspecified: Secondary | ICD-10-CM | POA: Diagnosis not present

## 2023-04-30 DIAGNOSIS — Z803 Family history of malignant neoplasm of breast: Secondary | ICD-10-CM

## 2023-04-30 DIAGNOSIS — F17290 Nicotine dependence, other tobacco product, uncomplicated: Secondary | ICD-10-CM | POA: Diagnosis not present

## 2023-04-30 LAB — CBC WITH DIFFERENTIAL/PLATELET
Abs Immature Granulocytes: 0 10*3/uL (ref 0.00–0.07)
Basophils Absolute: 0 10*3/uL (ref 0.0–0.1)
Basophils Relative: 1 %
Eosinophils Absolute: 0.2 10*3/uL (ref 0.0–0.5)
Eosinophils Relative: 3 %
HCT: 41.8 % (ref 39.0–52.0)
Hemoglobin: 14 g/dL (ref 13.0–17.0)
Immature Granulocytes: 0 %
Lymphocytes Relative: 27 %
Lymphs Abs: 1.3 10*3/uL (ref 0.7–4.0)
MCH: 29.4 pg (ref 26.0–34.0)
MCHC: 33.5 g/dL (ref 30.0–36.0)
MCV: 87.6 fL (ref 80.0–100.0)
Monocytes Absolute: 0.5 10*3/uL (ref 0.1–1.0)
Monocytes Relative: 10 %
Neutro Abs: 2.8 10*3/uL (ref 1.7–7.7)
Neutrophils Relative %: 59 %
Platelets: 279 10*3/uL (ref 150–400)
RBC: 4.77 MIL/uL (ref 4.22–5.81)
RDW: 12.9 % (ref 11.5–15.5)
WBC: 4.7 10*3/uL (ref 4.0–10.5)
nRBC: 0 % (ref 0.0–0.2)

## 2023-04-30 LAB — HEPATITIS PANEL, ACUTE
HCV Ab: NONREACTIVE
Hep A IgM: NONREACTIVE
Hep B C IgM: NONREACTIVE
Hepatitis B Surface Ag: NONREACTIVE

## 2023-04-30 LAB — VITAMIN B12: Vitamin B-12: 540 pg/mL (ref 180–914)

## 2023-04-30 LAB — TECHNOLOGIST SMEAR REVIEW: Plt Morphology: NORMAL

## 2023-04-30 LAB — LACTATE DEHYDROGENASE: LDH: 128 U/L (ref 98–192)

## 2023-04-30 LAB — HIV ANTIBODY (ROUTINE TESTING W REFLEX): HIV Screen 4th Generation wRfx: NONREACTIVE

## 2023-04-30 LAB — FOLATE: Folate: 10.8 ng/mL (ref >5.9)

## 2023-04-30 NOTE — Assessment & Plan Note (Signed)
I discussed with patient that the differential diagnosis of leukopenia is broad, including acute or chronic infection, inflammation, nutrition deficiency, autoimmune disease, alcohol use, ethnic neutropenia, or malignant etiology including underlying bone morrow disorders.  He has no consitutional symptoms. 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  For the work up of patient's leukoepenia, I recommend checking CBC LDH; smear review, folate, Vitamin B12, hepatitis, HIV, flowcytometry and monoclonal gammopathy workup.

## 2023-04-30 NOTE — Progress Notes (Signed)
Hematology/Oncology Consult Note [phone] [phone]    REFERRING PROVIDER: Bethanie Dicker, NP   CHIEF COMPLAINTS/REASON FOR VISIT:  Evaluation of leukopenia   ASSESSMENT & PLAN:   Neutropenia (HCC) I discussed with patient that the differential diagnosis of leukopenia is broad, including acute or chronic infection, inflammation, nutrition deficiency, autoimmune disease, alcohol use, ethnic neutropenia, or malignant etiology including underlying bone morrow disorders.  He has no consitutional symptoms. 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  For the work up of patient's leukoepenia, I recommend checking CBC LDH; smear review, folate, Vitamin B12, hepatitis, HIV, flowcytometry and monoclonal gammopathy workup.     Orders Placed This Encounter  Procedures   Vitamin B12    Standing Status:   Future    Number of Occurrences:   1    Standing Expiration Date:   04/29/2024   Flow cytometry panel-leukemia/lymphoma work-up    Standing Status:   Future    Number of Occurrences:   1    Standing Expiration Date:   04/29/2024   Lactate dehydrogenase    Standing Status:   Future    Number of Occurrences:   1    Standing Expiration Date:   04/29/2024   Folate    Standing Status:   Future    Number of Occurrences:   1    Standing Expiration Date:   04/29/2024   CBC with Differential/Platelet    Standing Status:   Future    Number of Occurrences:   1    Standing Expiration Date:   04/29/2024   HIV Antibody (routine testing w rflx)    Standing Status:   Future    Number of Occurrences:   1    Standing Expiration Date:   04/29/2024   Hepatitis panel, acute    Standing Status:   Future    Number of Occurrences:   1    Standing Expiration Date:   04/29/2024   Protein electrophoresis, serum    Standing Status:   Future    Number of Occurrences:   1    Standing Expiration Date:   04/29/2024   Technologist smear review    Standing Status:   Future    Number of Occurrences:   1     Standing Expiration Date:   04/29/2024    Order Specific Question:   Clinical information:    Answer:   neutropenia   RTC 2-3 weeks.  All questions were answered. The patient knows to call the clinic with any problems, questions or concerns.  Rickard Patience, MD, PhD [facility] Hematology Oncology 04/30/2023    HISTORY OF PRESENTING ILLNESS:  Jim Richard is a 27 y.o. male who was seen in consultation at the request of Bethanie Dicker, NP for evaluation of leukopenia Reviewed patient's recent labs. 04/23/23 Patient has low total WBC count was3.3 , predominantly neutropenia, with ANC 1.2 Previous lab records reviewed. Leukopenia duration is chronic onset, duration is since at least 3 years ago  Patient denies fatigue, weight loss, fever, chills, frequent infection.  Denies history hepatitis or HIV infection Denies history of chronic liver disease Denies routine alcohol consumption. He drinks on weekends, sometime liquor Denies dietary restrictions.  Denies Herbal medication    MEDICAL HISTORY:  Past Medical History:  Diagnosis Date   Inguinal pain 06/26/2017    SURGICAL HISTORY: Past Surgical History:  Procedure Laterality Date   TONSILLECTOMY      SOCIAL HISTORY: Social History   Socioeconomic History   Marital status: Single  Spouse name: Not on file   Number of children: Not on file   Years of education: Not on file   Highest education level: GED or equivalent  Occupational History   Not on file  Tobacco Use   Smoking status: Never   Smokeless tobacco: Never  Vaping Use   Vaping Use: Every day   Devices: tobacco since 2018-   Substance and Sexual Activity   Alcohol use: Yes    Comment: occasionally/ socially   Drug use: Never   Sexual activity: Yes    Birth control/protection: Condom  Other Topics Concern   Not on file  Social History Narrative       Single lives with parents and 2 younger brothers   Social Determinants of Health   Financial Resource  Strain: Not on file  Food Insecurity: No Food Insecurity (04/30/2023)   Hunger Vital Sign    Worried About Running Out of Food in the Last Year: Never true    Ran Out of Food in the Last Year: Never true  Transportation Needs: No Transportation Needs (04/30/2023)   PRAPARE - Administrator, Civil Service (Medical): No    Lack of Transportation (Non-Medical): No  Physical Activity: Not on file  Stress: Not on file  Social Connections: Not on file  Intimate Partner Violence: Not At Risk (04/30/2023)   Humiliation, Afraid, Rape, and Kick questionnaire    Fear of Current or Ex-Partner: No    Emotionally Abused: No    Physically Abused: No    Sexually Abused: No    FAMILY HISTORY: Family History  Problem Relation Age of Onset   Healthy Mother    Healthy Father    Breast cancer Paternal Grandmother    Hypertension Maternal Grandmother    Diabetes Maternal Grandmother    Stroke Maternal Grandfather    Hypertension Maternal Grandfather    ADD / ADHD Brother     ALLERGIES:  has No Known Allergies.  MEDICATIONS:  Current Outpatient Medications  Medication Sig Dispense Refill   Vitamin D, Ergocalciferol, (DRISDOL) 1.25 MG (50000 UNIT) CAPS capsule Take 1 capsule (50,000 Units total) by mouth every 7 (seven) days. 13 capsule 1   No current facility-administered medications for this visit.     Review of Systems  Constitutional:  Negative for appetite change, chills, fatigue, fever and unexpected weight change.  HENT:   Negative for hearing loss and voice change.   Eyes:  Negative for eye problems and icterus.  Respiratory:  Negative for chest tightness, cough and shortness of breath.   Cardiovascular:  Negative for chest pain and leg swelling.  Gastrointestinal:  Negative for abdominal distention and abdominal pain.  Endocrine: Negative for hot flashes.  Genitourinary:  Negative for difficulty urinating, dysuria and frequency.   Musculoskeletal:  Negative for  arthralgias.  Skin:  Negative for itching and rash.  Neurological:  Negative for light-headedness and numbness.  Hematological:  Negative for adenopathy. Does not bruise/bleed easily.  Psychiatric/Behavioral:  Negative for confusion.     PHYSICAL EXAMINATION: ECOG PERFORMANCE STATUS: 0 - Asymptomatic Vitals:   04/30/23 0936  BP: 109/70  Pulse: 74  Resp: 18  Temp: (!) 96.3 F (35.7 C)   Filed Weights   04/30/23 0936  Weight: 133 lb 9.6 oz (60.6 kg)     Physical Exam Constitutional:      General: He is not in acute distress. HENT:     Head: Normocephalic and atraumatic.  Eyes:     General:  No scleral icterus. Cardiovascular:     Rate and Rhythm: Normal rate and regular rhythm.     Heart sounds: Normal heart sounds.  Pulmonary:     Effort: Pulmonary effort is normal. No respiratory distress.     Breath sounds: No wheezing.  Abdominal:     General: Bowel sounds are normal. There is no distension.     Palpations: Abdomen is soft.  Musculoskeletal:        General: No deformity. Normal range of motion.     Cervical back: Normal range of motion and neck supple.  Skin:    General: Skin is warm and dry.     Findings: No erythema or rash.  Neurological:     Mental Status: He is alert and oriented to person, place, and time. Mental status is at baseline.     Cranial Nerves: No cranial nerve deficit.     Coordination: Coordination normal.  Psychiatric:        Mood and Affect: Mood normal.     RADIOGRAPHIC STUDIES: I have personally reviewed the radiological images as listed and agreed with the findings in the report. No results found.   LABORATORY DATA:  I have reviewed the data as listed    Latest Ref Rng & Units 04/30/2023    9:52 AM 04/23/2023    1:10 PM 04/30/2020    2:05 PM  CBC  WBC 4.0 - 10.5 K/uL 4.7  3.3  3.9   Hemoglobin 13.0 - 17.0 g/dL 16.1  09.6  04.5   Hematocrit 39.0 - 52.0 % 41.8  43.4  42.7   Platelets 150 - 400 K/uL 279  290.0  279.0        Latest Ref Rng & Units 04/23/2023    1:10 PM 04/30/2020    2:05 PM  CMP  Glucose 70 - 99 mg/dL 90  88   BUN 6 - 23 mg/dL 24  19   Creatinine 4.09 - 1.50 mg/dL 8.11  9.14   Sodium 782 - 145 mEq/L 137  139   Potassium 3.5 - 5.1 mEq/L 4.6  4.7   Chloride 96 - 112 mEq/L 102  103   CO2 19 - 32 mEq/L 28  29   Calcium 8.4 - 10.5 mg/dL 9.7  9.9   Total Protein 6.0 - 8.3 g/dL 7.1  6.8   Total Bilirubin 0.2 - 1.2 mg/dL 0.4  0.3   Alkaline Phos 39 - 117 U/L 53  58   AST 0 - 37 U/L 12  22   ALT 0 - 53 U/L 10  17        RADIOGRAPHIC STUDIES: I have personally reviewed the radiological images as listed and agreed with the findings in the report. No results found.

## 2023-05-04 LAB — PROTEIN ELECTROPHORESIS, SERUM
A/G Ratio: 1.5 (ref 0.7–1.7)
Albumin ELP: 4.1 g/dL (ref 2.9–4.4)
Alpha-1-Globulin: 0.3 g/dL (ref 0.0–0.4)
Alpha-2-Globulin: 0.9 g/dL (ref 0.4–1.0)
Beta Globulin: 0.9 g/dL (ref 0.7–1.3)
Gamma Globulin: 0.7 g/dL (ref 0.4–1.8)
Globulin, Total: 2.8 g/dL (ref 2.2–3.9)
Total Protein ELP: 6.9 g/dL (ref 6.0–8.5)

## 2023-05-05 LAB — COMP PANEL: LEUKEMIA/LYMPHOMA

## 2023-05-21 ENCOUNTER — Inpatient Hospital Stay: Payer: Managed Care, Other (non HMO) | Attending: Oncology | Admitting: Oncology

## 2023-05-21 ENCOUNTER — Encounter: Payer: Self-pay | Admitting: Oncology

## 2023-05-21 VITALS — BP 109/59 | HR 76 | Temp 96.0°F | Resp 18 | Wt 133.1 lb

## 2023-05-21 DIAGNOSIS — Z789 Other specified health status: Secondary | ICD-10-CM | POA: Diagnosis not present

## 2023-05-21 DIAGNOSIS — D708 Other neutropenia: Secondary | ICD-10-CM

## 2023-05-21 DIAGNOSIS — F1090 Alcohol use, unspecified, uncomplicated: Secondary | ICD-10-CM | POA: Diagnosis not present

## 2023-05-21 DIAGNOSIS — D709 Neutropenia, unspecified: Secondary | ICD-10-CM | POA: Diagnosis present

## 2023-05-21 NOTE — Progress Notes (Signed)
Hematology/Oncology Progress note [phone] [phone]      REFERRING PROVIDER: Bethanie Dicker, NP   CHIEF COMPLAINTS/REASON FOR VISIT:   leukopenia   ASSESSMENT & PLAN:   Neutropenia (HCC) Labs are reviewed and discussed with patient. Cbc showed normal wbc and ANC.  Normal LDH, B12, folate, negative hepatitis, HIV. Negative M protein.  Flowcytometry showed 10% monocytosis, non specific. I will repeat in 6 months.    Alcohol use Encourage patient's cessation effort.    Orders Placed This Encounter  Procedures   CBC with Differential (Cancer Center Only)    Standing Status:   Future    Standing Expiration Date:   05/20/2024   Flow cytometry panel-leukemia/lymphoma work-up    Standing Status:   Future    Standing Expiration Date:   05/20/2024   Technologist smear review    Standing Status:   Future    Standing Expiration Date:   05/20/2024    Order Specific Question:   Clinical information:    Answer:   Other neutropenia   Epstein barr vrs(ebv dna by pcr)    Standing Status:   Future    Standing Expiration Date:   05/20/2024   RTC 6 months.  All questions were answered. The patient knows to call the clinic with any problems, questions or concerns.  Rickard Patience, MD, PhD [facility] Hematology Oncology 05/21/2023    HISTORY OF PRESENTING ILLNESS:  Jim Richard is a 27 y.o. male who was seen in consultation at the request of Bethanie Dicker, NP for evaluation of leukopenia Reviewed patient's recent labs. 04/23/23 Patient has low total WBC count was3.3 , predominantly neutropenia, with ANC 1.2 Previous lab records reviewed. Leukopenia duration is chronic onset, duration is since at least 3 years ago  Patient denies fatigue, weight loss, fever, chills, frequent infection.  Denies history hepatitis or HIV infection Denies history of chronic liver disease Denies routine alcohol consumption. He drinks on weekends, sometime liquor Denies dietary  restrictions.  Denies Herbal medication   INTERVAL HISTORY Jim Richard is a 27 y.o. male who has above history reviewed by me today presents for follow up visit for neutropenia.  He presents to discuss results.   MEDICAL HISTORY:  Past Medical History:  Diagnosis Date   Inguinal pain 06/26/2017    SURGICAL HISTORY: Past Surgical History:  Procedure Laterality Date   TONSILLECTOMY      SOCIAL HISTORY: Social History   Socioeconomic History   Marital status: Single    Spouse name: Not on file   Number of children: Not on file   Years of education: Not on file   Highest education level: GED or equivalent  Occupational History   Not on file  Tobacco Use   Smoking status: Never   Smokeless tobacco: Never  Vaping Use   Vaping Use: Every day   Devices: tobacco since 2018-   Substance and Sexual Activity   Alcohol use: Yes    Comment: occasionally/ socially   Drug use: Never   Sexual activity: Yes    Birth control/protection: Condom  Other Topics Concern   Not on file  Social History Narrative       Single lives with parents and 2 younger brothers   Social Determinants of Health   Financial Resource Strain: Not on file  Food Insecurity: No Food Insecurity (04/30/2023)   Hunger Vital Sign    Worried About Running Out of Food in the Last Year: Never true    Ran Out  of Food in the Last Year: Never true  Transportation Needs: No Transportation Needs (04/30/2023)   PRAPARE - Administrator, Civil Service (Medical): No    Lack of Transportation (Non-Medical): No  Physical Activity: Not on file  Stress: Not on file  Social Connections: Not on file  Intimate Partner Violence: Not At Risk (04/30/2023)   Humiliation, Afraid, Rape, and Kick questionnaire    Fear of Current or Ex-Partner: No    Emotionally Abused: No    Physically Abused: No    Sexually Abused: No    FAMILY HISTORY: Family History  Problem Relation Age of Onset   Healthy Mother     Healthy Father    Breast cancer Paternal Grandmother    Hypertension Maternal Grandmother    Diabetes Maternal Grandmother    Stroke Maternal Grandfather    Hypertension Maternal Grandfather    ADD / ADHD Brother     ALLERGIES:  has No Known Allergies.  MEDICATIONS:  Current Outpatient Medications  Medication Sig Dispense Refill   Vitamin D, Ergocalciferol, (DRISDOL) 1.25 MG (50000 UNIT) CAPS capsule Take 1 capsule (50,000 Units total) by mouth every 7 (seven) days. 13 capsule 1   No current facility-administered medications for this visit.     Review of Systems  Constitutional:  Negative for appetite change, chills, fatigue, fever and unexpected weight change.  HENT:   Negative for hearing loss and voice change.   Eyes:  Negative for eye problems and icterus.  Respiratory:  Negative for chest tightness, cough and shortness of breath.   Cardiovascular:  Negative for chest pain and leg swelling.  Gastrointestinal:  Negative for abdominal distention and abdominal pain.  Endocrine: Negative for hot flashes.  Genitourinary:  Negative for difficulty urinating, dysuria and frequency.   Musculoskeletal:  Negative for arthralgias.  Skin:  Negative for itching and rash.  Neurological:  Negative for light-headedness and numbness.  Hematological:  Negative for adenopathy. Does not bruise/bleed easily.  Psychiatric/Behavioral:  Negative for confusion.     PHYSICAL EXAMINATION: ECOG PERFORMANCE STATUS: 0 - Asymptomatic Vitals:   05/21/23 1043  BP: (!) 109/59  Pulse: 76  Resp: 18  Temp: (!) 96 F (35.6 C)  SpO2: 100%   Filed Weights   05/21/23 1043  Weight: 133 lb 1.6 oz (60.4 kg)     Physical Exam Constitutional:      General: He is not in acute distress. HENT:     Head: Normocephalic and atraumatic.  Eyes:     General: No scleral icterus. Cardiovascular:     Rate and Rhythm: Normal rate and regular rhythm.     Heart sounds: Normal heart sounds.  Pulmonary:      Effort: Pulmonary effort is normal. No respiratory distress.     Breath sounds: No wheezing.  Abdominal:     General: Bowel sounds are normal. There is no distension.     Palpations: Abdomen is soft.  Musculoskeletal:        General: No deformity. Normal range of motion.     Cervical back: Normal range of motion and neck supple.  Skin:    General: Skin is warm and dry.     Findings: No erythema or rash.  Neurological:     Mental Status: He is alert and oriented to person, place, and time. Mental status is at baseline.     Cranial Nerves: No cranial nerve deficit.     Coordination: Coordination normal.  Psychiatric:  Mood and Affect: Mood normal.     RADIOGRAPHIC STUDIES: I have personally reviewed the radiological images as listed and agreed with the findings in the report. No results found.   LABORATORY DATA:  I have reviewed the data as listed    Latest Ref Rng & Units 04/30/2023    9:52 AM 04/23/2023    1:10 PM 04/30/2020    2:05 PM  CBC  WBC 4.0 - 10.5 K/uL 4.7  3.3  3.9   Hemoglobin 13.0 - 17.0 g/dL 09.8  11.9  14.7   Hematocrit 39.0 - 52.0 % 41.8  43.4  42.7   Platelets 150 - 400 K/uL 279  290.0  279.0       Latest Ref Rng & Units 04/23/2023    1:10 PM 04/30/2020    2:05 PM  CMP  Glucose 70 - 99 mg/dL 90  88   BUN 6 - 23 mg/dL 24  19   Creatinine 8.29 - 1.50 mg/dL 5.62  1.30   Sodium 865 - 145 mEq/L 137  139   Potassium 3.5 - 5.1 mEq/L 4.6  4.7   Chloride 96 - 112 mEq/L 102  103   CO2 19 - 32 mEq/L 28  29   Calcium 8.4 - 10.5 mg/dL 9.7  9.9   Total Protein 6.0 - 8.3 g/dL 7.1  6.8   Total Bilirubin 0.2 - 1.2 mg/dL 0.4  0.3   Alkaline Phos 39 - 117 U/L 53  58   AST 0 - 37 U/L 12  22   ALT 0 - 53 U/L 10  17        RADIOGRAPHIC STUDIES: I have personally reviewed the radiological images as listed and agreed with the findings in the report. No results found.

## 2023-05-21 NOTE — Assessment & Plan Note (Signed)
Labs are reviewed and discussed with patient. Cbc showed normal wbc and ANC.  Normal LDH, B12, folate, negative hepatitis, HIV. Negative M protein.  Flowcytometry showed 10% monocytosis, non specific. I will repeat in 6 months.

## 2023-05-21 NOTE — Assessment & Plan Note (Signed)
Encourage patient's cessation effort.

## 2023-11-18 ENCOUNTER — Inpatient Hospital Stay: Payer: Managed Care, Other (non HMO) | Attending: Oncology

## 2023-11-24 ENCOUNTER — Telehealth: Payer: Self-pay

## 2023-11-24 NOTE — Telephone Encounter (Signed)
Pt no showed to labs last week. Please r/s appt- just next avail.   labs 1 week prior to MD.

## 2023-11-25 ENCOUNTER — Inpatient Hospital Stay: Payer: Managed Care, Other (non HMO) | Admitting: Oncology
